# Patient Record
Sex: Female | Born: 2000 | Race: White | Hispanic: No | Marital: Single | Smoking: Never smoker
Health system: Southern US, Community
[De-identification: ages and names within clinical notes are randomized; demographics above are authoritative.]

## PROBLEM LIST (undated history)

## (undated) DIAGNOSIS — L709 Acne, unspecified: Secondary | ICD-10-CM

## (undated) DIAGNOSIS — F419 Anxiety disorder, unspecified: Secondary | ICD-10-CM

## (undated) DIAGNOSIS — G43909 Migraine, unspecified, not intractable, without status migrainosus: Secondary | ICD-10-CM

## (undated) HISTORY — DX: Anxiety disorder, unspecified: F41.9

## (undated) HISTORY — DX: Acne, unspecified: L70.9

## (undated) HISTORY — DX: Migraine, unspecified, not intractable, without status migrainosus: G43.909

---
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
## 2019-01-29 ENCOUNTER — Other Ambulatory Visit: Payer: Self-pay

## 2019-01-29 DIAGNOSIS — Z20822 Contact with and (suspected) exposure to covid-19: Secondary | ICD-10-CM

## 2019-01-30 LAB — NOVEL CORONAVIRUS, NAA: SARS-CoV-2, NAA: DETECTED — AB

## 2019-02-05 ENCOUNTER — Encounter: Payer: Self-pay | Admitting: Physician Assistant

## 2019-03-05 ENCOUNTER — Ambulatory Visit: Payer: Managed Care, Other (non HMO) | Admitting: Physician Assistant

## 2019-03-05 ENCOUNTER — Other Ambulatory Visit: Payer: Self-pay

## 2019-03-05 ENCOUNTER — Encounter: Payer: Self-pay | Admitting: Physician Assistant

## 2019-03-05 VITALS — BP 120/80 | HR 76 | Temp 98.2°F | Ht 68.5 in | Wt 152.4 lb

## 2019-03-05 DIAGNOSIS — G43809 Other migraine, not intractable, without status migrainosus: Secondary | ICD-10-CM | POA: Diagnosis not present

## 2019-03-05 DIAGNOSIS — Z3041 Encounter for surveillance of contraceptive pills: Secondary | ICD-10-CM | POA: Diagnosis not present

## 2019-03-05 DIAGNOSIS — Z23 Encounter for immunization: Secondary | ICD-10-CM

## 2019-03-05 MED ORDER — NORETHIN-ETH ESTRAD TRIPHASIC 0.5/0.75/1-35 MG-MCG PO TABS: 1.0000 | ORAL_TABLET | Freq: Every day | ORAL | 11 refills | Status: DC

## 2019-03-05 MED ORDER — AMITRIPTYLINE HCL 50 MG PO TABS: 50.0000 mg | ORAL_TABLET | Freq: Every day | ORAL | 1 refills | Status: AC

## 2019-03-05 MED ORDER — SUMATRIPTAN SUCCINATE 25 MG PO TABS: 25.0000 mg | ORAL_TABLET | ORAL | 0 refills | Status: AC | PRN

## 2019-03-05 NOTE — Patient Instructions (Signed)
It was great to see you!  Please return in two months for a nurse visit only to have your second Bexsero vaccine.  Take care,  Inda Coke PA-C

## 2019-03-05 NOTE — Progress Notes (Signed)
Gina Marshall is a 18 y.o. female here for a follow up of a pre-existing problem.  History of Present Illness:   Chief Complaint  Patient presents with  . Medication Refill    HPI   Migraine -- tolerating her Elavil 50 mg daily.  And uses Imitrex 25 mg as needed for migraines.  She has had 2 migraines since starting college.  Last headache she had was associated with her period.  Denies any new symptoms to her migraines. Denies auras.  Oral contraceptives --patient reports that she is tolerating her contraceptives well. Currently on Cyclafem.  She like to continue them.  She denies any concerns with them.  She feels like it controls her periods well.  Past Medical History:  Diagnosis Date  . Acne   . Anxiety   . Migraine 07/10/2018     Social History   Socioeconomic History  . Marital status: Single    Spouse name: Not on file  . Number of children: Not on file  . Years of education: Not on file  . Highest education level: Not on file  Occupational History  . Not on file  Social Needs  . Financial resource strain: Not on file  . Food insecurity    Worry: Not on file    Inability: Not on file  . Transportation needs    Medical: Not on file    Non-medical: Not on file  Tobacco Use  . Smoking status: Never Smoker  . Smokeless tobacco: Never Used  Substance and Sexual Activity  . Alcohol use: No  . Drug use: No  . Sexual activity: Never  Lifestyle  . Physical activity    Days per week: Not on file    Minutes per session: Not on file  . Stress: Not on file  Relationships  . Social Musician on phone: Not on file    Gets together: Not on file    Attends religious service: Not on file    Active member of club or organization: Not on file    Attends meetings of clubs or organizations: Not on file    Relationship status: Not on file  . Intimate partner violence    Fear of current or ex partner: Not on file    Emotionally abused: Not on file    Physically  abused: Not on file    Forced sexual activity: Not on file  Other Topics Concern  . Not on file  Social History Narrative   [employer], going to be junior   In [employer] at home with mom    History reviewed. No pertinent surgical history.  Family History  Problem Relation Age of Onset  . Cancer Paternal Grandmother        breast  . Cancer Paternal Grandfather        colon (rare and aggressive form)    No Known Allergies  Current Medications:   Current Outpatient Medications:  .  norethindrone-ethinyl estradiol (CYCLAFEM) 0.5/0.75/1-35 MG-MCG tablet, Take 1 tablet by mouth daily., Disp: 1 Package, Rfl: 11 .  SUMAtriptan (IMITREX) 25 MG tablet, Take 1 tablet (25 mg total) by mouth every 2 (two) hours as needed for migraine. May repeat in 2 hours if headache persists or recurs., Disp: 10 tablet, Rfl: 0 .  amitriptyline (ELAVIL) 50 MG tablet, Take 1 tablet (50 mg total) by mouth at bedtime., Disp: 90 tablet, Rfl: 1   Review of Systems:   ROS Negative unless  otherwise specified per HPI.  Vitals:   Vitals:   03/05/19 1441  BP: 120/80  Pulse: 76  Temp: 98.2 F (36.8 C)  TempSrc: Temporal  SpO2: 98%  Weight: 152 lb 6.1 oz (69.1 kg)  Height: 5' 8.5" (1.74 m)     Body mass index is 22.83 kg/m.  Physical Exam:   Physical Exam Vitals signs and nursing note reviewed.  Constitutional:      General: She is not in acute distress.    Appearance: She is well-developed. She is not ill-appearing or toxic-appearing.  Cardiovascular:     Rate and Rhythm: Normal rate and regular rhythm.     Pulses: Normal pulses.     Heart sounds: Normal heart sounds, S1 normal and S2 normal.     Comments: No LE edema Pulmonary:     Effort: Pulmonary effort is normal.     Breath sounds: Normal breath sounds.  Skin:    General: Skin is warm and dry.  Neurological:     Mental Status: She is alert.     GCS: GCS eye subscore is 4. GCS verbal subscore is 5. GCS motor subscore  is 6.  Psychiatric:        Speech: Speech normal.        Behavior: Behavior normal. Behavior is cooperative.     Results for orders placed or performed in visit on 01/29/19  Novel Coronavirus, NAA ([facility])   Specimen: Oropharyngeal(OP) collection in vial transport medium   OROPHARYNGEA  TESTING  Result Value Ref Range   SARS-CoV-2, NAA Detected (A) Not Detected    Assessment and Plan:   Gina Marshall was seen today for medication refill.  Diagnoses and all orders for this visit:  Other migraine without status migrainosus, not intractable Stable. Continue elavil 50 mg daily and imitrex 25 mg prn.  Need for meningitis vaccination -     Meningococcal B, OMV  Encounter for surveillance of contraceptive pills Refill cyclafem today.   Other orders -     norethindrone-ethinyl estradiol (CYCLAFEM) 0.5/0.75/1-35 MG-MCG tablet; Take 1 tablet by mouth daily. -     SUMAtriptan (IMITREX) 25 MG tablet; Take 1 tablet (25 mg total) by mouth every 2 (two) hours as needed for migraine. May repeat in 2 hours if headache persists or recurs. -     amitriptyline (ELAVIL) 50 MG tablet; Take 1 tablet (50 mg total) by mouth at bedtime.    . Reviewed expectations re: course of current medical issues. . Discussed self-management of symptoms. . Outlined signs and symptoms indicating need for more acute intervention. . Patient verbalized understanding and all questions were answered. . See orders for this visit as documented in the electronic medical record. . Patient received an After-Visit Summary.  CMA or LPN served as scribe during this visit. History, Physical, and Plan performed by medical provider. The above documentation has been reviewed and is accurate and complete.   Inda Coke, PA-C

## 2019-05-10 ENCOUNTER — Ambulatory Visit (INDEPENDENT_AMBULATORY_CARE_PROVIDER_SITE_OTHER): Payer: Managed Care, Other (non HMO)

## 2019-05-10 ENCOUNTER — Other Ambulatory Visit: Payer: Self-pay

## 2019-05-10 DIAGNOSIS — Z23 Encounter for immunization: Secondary | ICD-10-CM | POA: Diagnosis not present

## 2019-05-10 NOTE — Progress Notes (Signed)
Per orders of [facility]  injection of [employer]  given by Nolon Lennert.In rt deltoid  Patient tolerated injection well.

## 2019-05-10 NOTE — Progress Notes (Signed)
Patient here for Bexsero vaccine.

## 2020-03-02 ENCOUNTER — Other Ambulatory Visit: Payer: Self-pay | Admitting: Physician Assistant
